# Patient Record
Sex: Female | Born: 1995 | Hispanic: No | Marital: Single | Smoking: Never smoker
Health system: Southern US, Community
[De-identification: ages and names within clinical notes are randomized; demographics above are authoritative.]

## PROBLEM LIST (undated history)

## (undated) DIAGNOSIS — J45909 Unspecified asthma, uncomplicated: Secondary | ICD-10-CM

## (undated) DIAGNOSIS — Z91013 Allergy to seafood: Secondary | ICD-10-CM

## (undated) DIAGNOSIS — L309 Dermatitis, unspecified: Secondary | ICD-10-CM

## (undated) DIAGNOSIS — M41125 Adolescent idiopathic scoliosis, thoracolumbar region: Secondary | ICD-10-CM

## (undated) DIAGNOSIS — Z9109 Other allergy status, other than to drugs and biological substances: Secondary | ICD-10-CM

## (undated) DIAGNOSIS — M84375A Stress fracture, left foot, initial encounter for fracture: Secondary | ICD-10-CM

## (undated) HISTORY — DX: Other allergy status, other than to drugs and biological substances: Z91.09

## (undated) HISTORY — DX: Dermatitis, unspecified: L30.9

## (undated) HISTORY — DX: Stress fracture, left foot, initial encounter for fracture: M84.375A

## (undated) HISTORY — DX: Adolescent idiopathic scoliosis, thoracolumbar region: M41.125

## (undated) HISTORY — DX: Allergy to seafood: Z91.013

---
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
## 2015-03-01 ENCOUNTER — Ambulatory Visit (INDEPENDENT_AMBULATORY_CARE_PROVIDER_SITE_OTHER): Payer: Commercial Managed Care - HMO | Admitting: Family Medicine

## 2015-03-01 ENCOUNTER — Encounter: Payer: Self-pay | Admitting: Family Medicine

## 2015-03-01 VITALS — BP 102/68 | HR 80 | Ht 67.5 in | Wt 134.4 lb

## 2015-03-01 DIAGNOSIS — Z0000 Encounter for general adult medical examination without abnormal findings: Secondary | ICD-10-CM

## 2015-03-01 DIAGNOSIS — Z23 Encounter for immunization: Secondary | ICD-10-CM

## 2015-03-01 DIAGNOSIS — Z83438 Family history of other disorder of lipoprotein metabolism and other lipidemia: Secondary | ICD-10-CM

## 2015-03-01 DIAGNOSIS — Z13220 Encounter for screening for lipoid disorders: Secondary | ICD-10-CM | POA: Diagnosis not present

## 2015-03-01 DIAGNOSIS — Z8349 Family history of other endocrine, nutritional and metabolic diseases: Secondary | ICD-10-CM | POA: Diagnosis not present

## 2015-03-01 LAB — POCT URINALYSIS DIPSTICK
Bilirubin, UA: NEGATIVE
GLUCOSE UA: NEGATIVE
Ketones, UA: NEGATIVE
LEUKOCYTES UA: NEGATIVE
NITRITE UA: NEGATIVE
Protein, UA: NEGATIVE
Spec Grav, UA: 1.025
Urobilinogen, UA: NEGATIVE
pH, UA: 5.5

## 2015-03-01 LAB — LIPID PANEL
CHOL/HDL RATIO: 2.5 ratio (ref <=5.0)
CHOLESTEROL: 158 mg/dL (ref 125–170)
HDL: 64 mg/dL (ref 36–76)
LDL Cholesterol: 84 mg/dL (ref <110)
TRIGLYCERIDES: 49 mg/dL (ref 40–136)
VLDL: 10 mg/dL (ref <30)

## 2015-03-01 NOTE — Progress Notes (Signed)
Chief Complaint  Patient presents with  . Annual Exam    nonfasting annual exam, no concerns. UA showed 3+ blood, pt is on menstrual cycle.    Brianna Mayer is a 19 y.o. female who presents for a complete physical.  She has the following concerns:  She had a wonderful trip to [place]. She leaves for college at [employer] in [place] on September 1st.  She will be living in a suite with 3 other girls; they will have a kitchen and do a lot of cooking, as well as using meal plan (mostly restaurants).  It is a nonsmoking building, and there is a gym on her floor. She has been in communication with her roommates and is excited for school.  She reports being driven, not into partying. Hasn't been in a sexual relationship and reports that she doesn't intend to be.  Her immunizations were reviewed--has never gotten Gardisil (she reports that her mother never wanted her to get it). She is nervous about shots and refuses flu shot   Immunization History  Administered Date(s) Administered  . DTaP 09/09/1996, 10/16/1996, 11/28/1996, 11/09/1997, 08/28/2001  . Hepatitis A 09/13/2006, 09/25/2007  . Hepatitis B 1995-08-16, 08/06/1996, 01/30/1997  . HiB (PRP-OMP) 09/09/1996, 10/16/1996, 11/28/1996, 11/09/1997  . IPV 09/09/1996, 10/16/1996, 11/09/1997, 08/28/2001  . Influenza-Unspecified 05/19/2005, 06/12/2007, 06/09/2009, 08/03/2010  . MMR 07/28/1997, 08/28/2001  . Meningococcal Conjugate 06/09/2009, 03/01/2015  . Tdap 09/25/2007  . Typhoid Live 12/15/2013  . Varicella 07/28/1997, 09/13/2006  . Yellow Fever 12/15/2013    Dentist: twice yearly Ophtho: wears glasses for reading, goes yearly Exercise: gym 4-5x/week Normal CBC 09/2013 (and also done 09/3013 at [facility], Hg 12.3 Paper chart reviewed--never had lipid screen.  Menses are somewhat irregular, sometimes will skip a month.  Sometimes gets bad cramps.  Past Medical History  Diagnosis Date  . Asthma     worse younger, now just  exercise-induced (prev tx'd by Dr. Neldon Mc)  . Environmental allergies     cat, dust, trees and grass (dust on testing 2005 by Dr. Carmelina Peal)  . Eczema   . Adolescent idiopathic scoliosis of thoracolumbar region 2004    Dr. Rennis Harding (no treatment needed)  . 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 No pertinent past surgical history.  Social History   Social History  . Marital Status: Single    Spouse Name: N/A  . Number of Children: N/A  . Years of Education: N/A   Occupational History  . Not on file.   Social History Main Topics  . Smoking status: Never Smoker   . Smokeless tobacco: Never Used  . Alcohol Use: No  . Drug Use: No  . Sexual Activity: No   Other Topics Concern  . Not on file   Social History Narrative   Lives with parents, younger sister. Graduated from [employer].  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       Family History  Problem Relation Age of Onset  . Hyperlipidemia Father   . Diabetes Maternal Grandmother   . Stroke Paternal Grandfather 36  . Hypertension Paternal Grandfather   . Cancer Neg Hx     Outpatient Encounter Prescriptions as of 03/01/2015  Medication Sig Note  . albuterol (PROVENTIL HFA;VENTOLIN HFA) 108 (90 BASE) MCG/ACT inhaler Inhale 2 puffs into the lungs every 6 (six) hours as needed for wheezing or shortness of breath. 11/23/2014: Uses prn after exercise (not regularly needed)  .  BIOTIN 5000 PO Take 1 tablet by mouth daily.   . naproxen sodium (ANAPROX) 220 MG tablet Take 440 mg by mouth 2 (two) times daily with a meal. 11/23/2014: Prn headache, pain  . vitamin C (ASCORBIC ACID) 500 MG tablet Take 500 mg by mouth daily.   . [DISCONTINUED] atovaquone-proguanil (MALARONE) 250-100 MG TABS Start 1-2 days prior to travel, and continue for 7 days after    No facility-administered encounter medications on file as of 03/01/2015.   No Known Allergies  ROS:  The patient denies anorexia, fever,  weight changes, headaches,  vision changes, decreased hearing, ear pain, sore throat, breast concerns, chest pain, palpitations, dizziness, syncope, dyspnea on exertion, cough, swelling, nausea, vomiting, diarrhea, constipation, abdominal pain, melena, hematochezia, indigestion/heartburn, hematuria, incontinence, dysuria, vaginal discharge, odor or itch, genital lesions, joint pains, numbness, tingling, weakness, tremor, suspicious skin lesions, depression, anxiety, abnormal bleeding/bruising, or enlarged lymph nodes. +irregular menses as per HPI.  Intermittently cramps are more severe than others. Last used inhaler a month ago (related to exercise)   PHYSICAL EXAM:  BP 102/68 mmHg  Pulse 80  Ht 5' 7.5" (1.715 m)  Wt 134 lb 6.4 oz (60.963 kg)  BMI 20.73 kg/m2  LMP 02/27/2015   General Appearance:    Alert, cooperative, no distress, appears stated age  Head:    Normocephalic, without obvious abnormality, atraumatic  Eyes:    PERRL, conjunctiva/corneas clear, EOM's intact, fundi    benign  Ears:    Normal TM's and external ear canals  Nose:   Nares normal, mucosa normal, no drainage or sinus   tenderness  Throat:   Lips, mucosa, and tongue normal; teeth and gums normal. Some asymmetry of the tonsils noted, R>L, not red, no exudate  Neck:   Supple, no lymphadenopathy;  thyroid:  no   enlargement/tenderness/nodules; no carotid   bruit or JVD  Back:    Spine nontender, ROM normal, no CVA tenderness. Minimal curvature noted, if any (mid-thoracic area).  Lungs:     Clear to auscultation bilaterally without wheezes, rales or     ronchi; respirations unlabored  Chest Wall:    No tenderness or deformity   Heart:    Regular rate and rhythm, S1 and S2 normal, no murmur, rub   or gallop  Breast Exam:    No tenderness, masses, or nipple discharge or inversion.      No axillary lymphadenopathy  Abdomen:     Soft, non-tender, nondistended, normoactive bowel sounds,    no masses, no hepatosplenomegaly   Genitalia:    Not performed.  Rectal:    Not performed due to age<40 and no related complaints  Extremities:   No clubbing, cyanosis or edema  Pulses:   2+ and symmetric all extremities  Skin:   Skin color, texture, turgor normal, no rashes or lesions  Lymph nodes:   Cervical, supraclavicular, and axillary nodes normal  Neurologic:   CNII-XII intact, normal strength, sensation and gait; reflexes 2+ and symmetric throughout          Psych:   Normal mood, affect, hygiene and grooming.      ASSESSMENT/PLAN:  Annual physical exam - Plan: POCT Urinalysis Dipstick, Visual acuity screening  Family history of hyperlipidemia - Plan: Lipid panel  Screening for lipoid disorders - Plan: Lipid panel  Need for meningococcal vaccination - Plan: Meningococcal conjugate vaccine 4-valent IM   Discussed monthly self breast exams and yearly mammograms after the age of 15; at least 30 minutes of aerobic activity  at least 5 days/week, weight-bearing exercise 2x/wk; proper sunscreen use reviewed; healthy diet, including goals of calcium and vitamin D intake and alcohol recommendations; regular seatbelt use; safe sex/abstinence/plan B.  Immunization recommendations discussed.   We discussed the following vaccines in great detail, all of which were recommended.  She then spoke with her mother, and decided only to get Menveo and lipid screen; declined Bexsero and HPV despite extensive counseling.  Will offer HPV at future visits.  Bexsero recommended--declined by patient Menveo (2nd of this type)--given HPV--declined by patient  Flu shot--refuses Lipid screen recommended (FH in her father)--done today, NONFASTING

## 2015-03-01 NOTE — Patient Instructions (Signed)
  HEALTH MAINTENANCE RECOMMENDATIONS:  It is recommended that you get at least 30 minutes of aerobic exercise at least 5 days/week (for weight loss, you may need as much as 60-90 minutes). This can be any activity that gets your heart rate up. This can be divided in 10-15 minute intervals if needed, but try and build up your endurance at least once a week.  Weight bearing exercise is also recommended twice weekly.  Eat a healthy diet with lots of vegetables, fruits and fiber.  "Colorful" foods have a lot of vitamins (ie green vegetables, tomatoes, red peppers, etc).  Limit sweet tea, regular sodas and alcoholic beverages, all of which has a lot of calories and sugar.   Drink a lot of water.  Calcium recommendations are 1200 mg daily and vitamin D 1000 IU daily.  This should be obtained from diet and/or supplements (vitamins), and calcium should not be taken all at once, but in divided doses.  Monthly self breast exams and yearly mammograms for women over the age of 51 is recommended.  Sunscreen of at least SPF 30 should be used on all sun-exposed parts of the skin when outside between the hours of 10 am and 4 pm (not just when at beach or pool, but even with exercise, golf, tennis, and yard work!)  Use a sunscreen that says "broad spectrum" so it covers both UVA and UVB rays, and make sure to reapply every 1-2 hours.  Remember to change the batteries in your smoke detectors when changing your clock times in the spring and fall.  Use your seat belt every time you are in a car, and please drive safely and not be distracted with cell phones and texting while driving.  Today we discussed abstinence, safe sex, Plan B, avoidance of alcohol, tobacco, recreational drugs, regular exercise, healthy diet.  We discussed the two types of Meningitis vaccines, Gardisil (HPV vaccine) and cholesterol screening. We also discussed yearly flu shots. These are all things that I recommend highly.

## 2015-07-08 ENCOUNTER — Ambulatory Visit: Payer: Commercial Managed Care - HMO | Admitting: Family Medicine

## 2015-10-04 ENCOUNTER — Telehealth: Payer: Self-pay | Admitting: Family Medicine

## 2015-10-04 NOTE — Telephone Encounter (Signed)
Mom emailing form for school that needs to be completed.  Form in your folder for completion

## 2015-10-04 NOTE — Telephone Encounter (Signed)
Faxed

## 2015-10-04 NOTE — Telephone Encounter (Signed)
FFO please fax

## 2016-01-12 ENCOUNTER — Ambulatory Visit: Payer: Commercial Managed Care - HMO | Admitting: Family Medicine

## 2016-01-23 IMAGING — CR DG ABDOMEN 1V
1 series · 1 of 1 positions shown · non-contrast
Comparison: None.

CLINICAL DATA: Sudden onset of abdominal pain

EXAM:
ABDOMEN - 1 VIEW

[t abdomen supine]
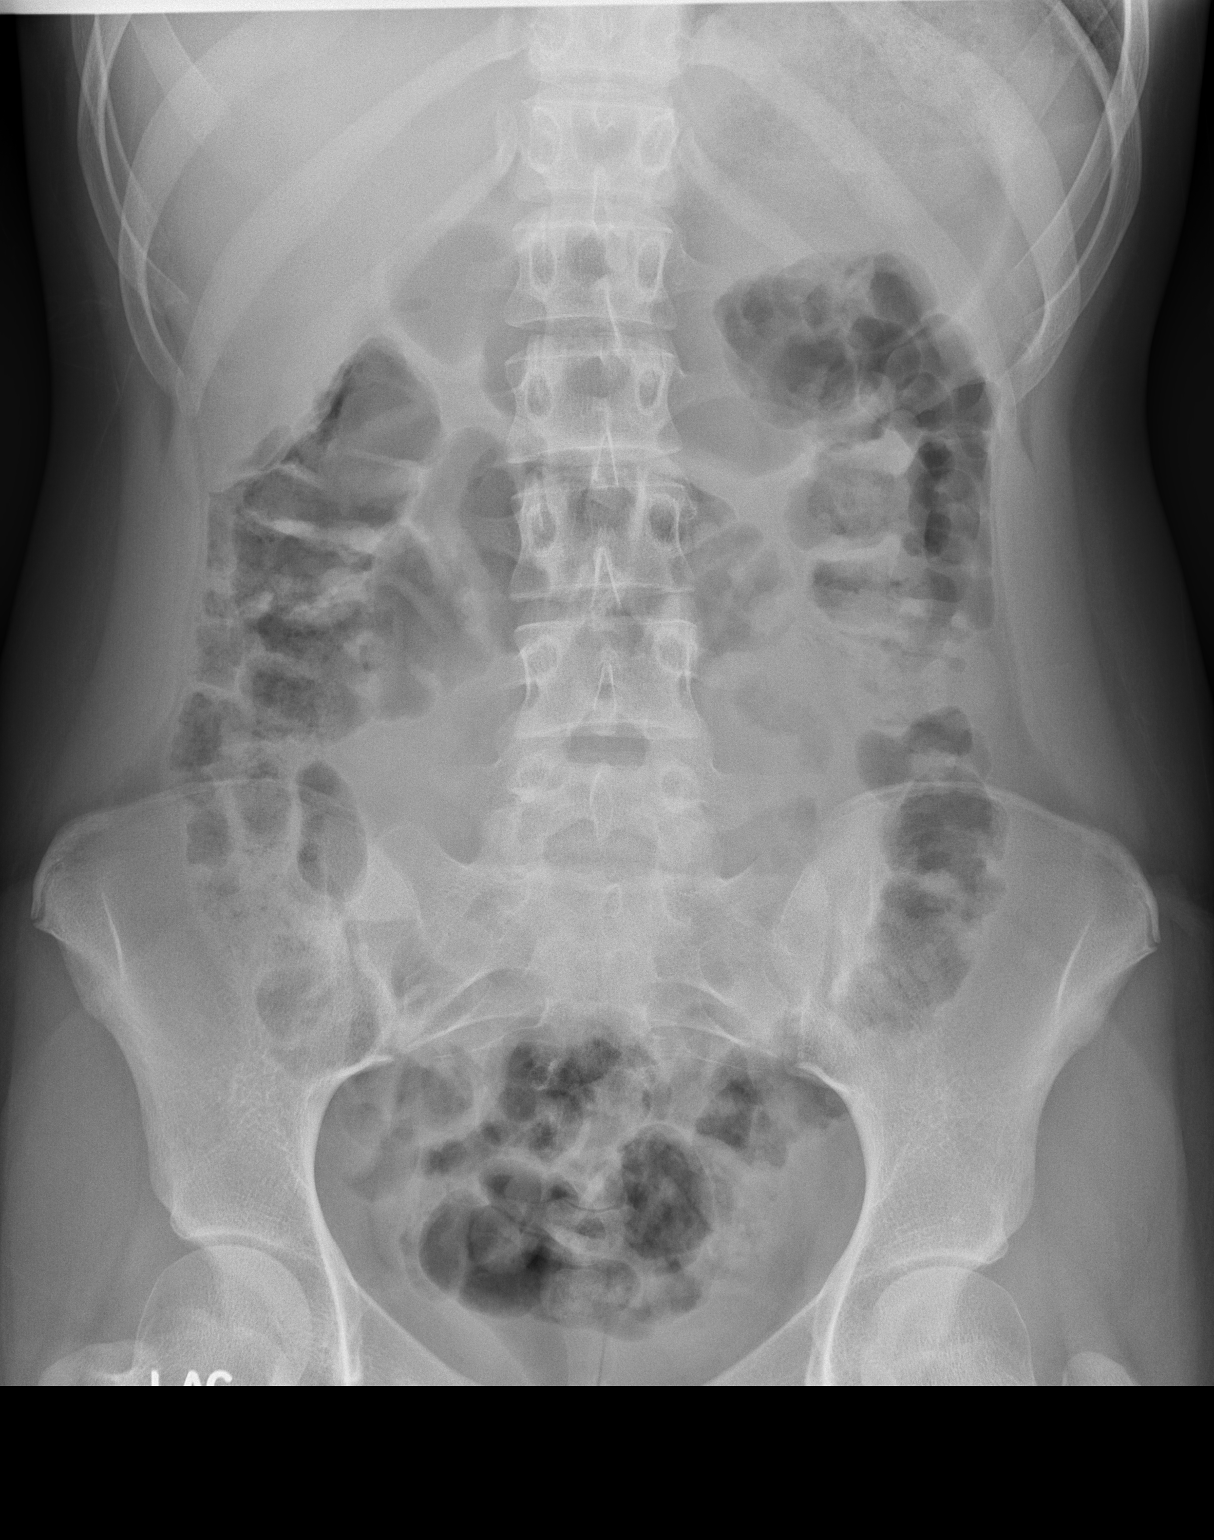

[1 of 1 positions shown; findings below may reference images not displayed]

FINDINGS: Hemidiaphragms/upper abdomen excluded from the field of view. Bowel
gas pattern nonobstructive. No acute osseous finding.
IMPRESSION: Nonobstructive bowel gas pattern.

## 2016-11-07 DIAGNOSIS — M84375A Stress fracture, left foot, initial encounter for fracture: Secondary | ICD-10-CM

## 2016-11-07 HISTORY — DX: Stress fracture, left foot, initial encounter for fracture: M84.375A

## 2017-03-04 NOTE — Progress Notes (Signed)
Chief Complaint  Patient presents with  . Annual Exam    fasting annual exam, no pap saw Dr Fogleman yesterday.  Had eye as well earlier this year and prefers to do there.     Brianna Mayer is a 21 y.o. female who presents for a complete physical.  She has the following concerns:  She attends [employer] in [place].  She is a rising senior (skipped a year, graduating in 3 years; had some credits from HS, and loaded up her courses). 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  Wants to travel to curate things in her year off.  MGM ill with pancreatic cancer, in [place].  She established with GYN Friday.  No cycle since January, had been irregular since the onset of menarche. She had labs drawn, hasn't gotten results yet.  She was given a 10 day course of progesterone, hasn't started it yet. Wants her to f/u in November and will possibly do an ultrasound. She got her first Gardisil on Friday.  H/o exercise induced asthma--only seems to be at high elevation; no longer needing albuterol related to exercise other times.  Immunization History  Administered Date(s) Administered  . DTaP 09/09/1996, 10/16/1996, 11/28/1996, 11/09/1997, 08/28/2001  . Hepatitis A 09/13/2006, 09/25/2007  . Hepatitis B 10/26/1995, 08/06/1996, 01/30/1997  . HiB (PRP-OMP) 09/09/1996, 10/16/1996, 11/28/1996, 11/09/1997  . IPV 09/09/1996, 10/16/1996, 11/09/1997, 08/28/2001  . Influenza-Unspecified 05/19/2005, 06/12/2007, 06/09/2009, 08/03/2010  . MMR 07/28/1997, 08/28/2001  . Meningococcal Conjugate 06/09/2009, 03/01/2015  . Tdap 09/25/2007  . Typhoid Live 12/15/2013  . Varicella 07/28/1997, 09/13/2006  . Yellow Fever 12/15/2013   Got first HPV vaccine on Friday at GYN's office Pap smear:  never Not sexually active  Alcohol: none Diet: healthy, well-rounded. Limited fast food.  Gets dairy. Only drinks water/coffee.  Dentist: twice yearly Ophtho: wears glasses for  reading, goes yearly Exercise: hot yoga 6 days/week (with strength training);  Walks 6-8 miles/day around [place] (up to 45 mins at a time). Had been training for marathon when she had navicular stress fracture in April. Normal CBC 09/2013 (and also done 09/3013 at [facility], Hg 12.3 Lipid screen (nonfasting): Lab Results  Component Value Date   CHOL 158 03/01/2015   HDL 64 03/01/2015   LDLCALC 84 03/01/2015   TRIG 49 03/01/2015   CHOLHDL 2.5 03/01/2015   Labs drawn last week by GYN, no results available.  Past Medical History:  Diagnosis Date  . Adolescent idiopathic scoliosis of thoracolumbar region 2004   Dr. Max Cohen (no treatment needed)  . Asthma    worse younger, now just exercise-induced (prev tx'd by Dr. Kozlow)  . Eczema   . Environmental allergies    cat, dust, trees and grass (dust on testing 2005 by Dr. Koslow)  . 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 Stress fracture of navicular bone of left foot 11/2016   while training for a marathon; treated with boot    History reviewed. No pertinent surgical history.  Social History   Social History  . Marital status: Single    Spouse name: N/A  . Number of children: N/A  . Years of education: N/A   Occupational History  . Not on file.   Social History Main Topics  . Smoking status: Never Smoker  . Smokeless tobacco: Never Used  . Alcohol use No  . Drug use: No  . Sexual activity: No   Other Topics Concern  . Not   on file   Social History Narrative   Lives with parents, younger sister. Graduated from [employer]. 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 Hyperlipidemia Father   . Diabetes Maternal Grandmother   . Pancreatic cancer Maternal Grandmother   . Cancer Maternal Grandmother   . Stroke Paternal Grandfather 45  . Hypertension Paternal Grandfather     Outpatient Encounter Prescriptions as of  03/05/2017  Medication Sig Note  . albuterol (PROVENTIL HFA;VENTOLIN HFA) 108 (90 BASE) MCG/ACT inhaler Inhale 2 puffs into the lungs every 6 (six) hours as needed for wheezing or shortness of breath. 11/23/2014: Uses prn after exercise (not regularly needed)  . BIOTIN 5000 PO Take 1 tablet by mouth daily.   . naproxen sodium (ANAPROX) 220 MG tablet Take 440 mg by mouth 2 (two) times daily with a meal. 11/23/2014: Prn headache, pain  . vitamin C (ASCORBIC ACID) 500 MG tablet Take 500 mg by mouth daily.    No facility-administered encounter medications on file as of 03/05/2017.     Allergies  Allergen Reactions  . Shellfish Allergy Other (See Comments)    Sharp abdominal pain    ROS:  The patient denies anorexia, fever, weight changes, headaches,  vision changes, decreased hearing, ear pain, sore throat, breast concerns, chest pain, palpitations, dizziness, syncope, dyspnea on exertion, cough, swelling, nausea, vomiting, diarrhea, constipation, abdominal pain, melena, hematochezia, indigestion/heartburn, hematuria, incontinence, dysuria, vaginal discharge, odor or itch, genital lesions, joint pains, numbness, tingling, weakness, tremor, suspicious skin lesions, depression, anxiety, abnormal bleeding/bruising, or enlarged lymph nodes. +irregular menses, and none since January, as per HPI. Uses albuterol inhaler prn--only needed when in [place], at altitude.   PHYSICAL EXAM:  BP 108/72 (BP Location: Left Arm, Patient Position: Sitting, Cuff Size: Normal)   Pulse 64   Ht 5' 8" (1.727 m)   Wt 138 lb (62.6 kg)   LMP 07/19/2016 (Approximate) Comment: irregular cycles  BMI 20.98 kg/m   Wt Readings from Last 3 Encounters:  03/05/17 138 lb (62.6 kg)  03/01/15 134 lb 6.4 oz (61 kg) (66 %, Z= 0.40)*  11/23/14 134 lb 3.2 oz (60.9 kg) (66 %, Z= 0.42)*   * Growth percentiles are based on CDC 2-20 Years data.    General Appearance:    Alert, cooperative, no distress, appears stated age  Head:     Normocephalic, without obvious abnormality, atraumatic  Eyes:    PERRL, conjunctiva/corneas clear, EOM's intact, fundi    benign  Ears:    Normal TM's and external ear canals  Nose:   Nares normal, mucosa is mildly edematous, no drainage or sinus tenderness  Throat:   Lips, mucosa, and tongue normal; teeth and gums normal. Some asymmetry of the tonsils noted, R>L, not red, no exudate  Neck:   Supple, no lymphadenopathy;  thyroid:  no   enlargement/tenderness/nodules; no carotid bruit or JVD  Back:    Spine nontender, ROM normal, no CVA tenderness. Minimal curvature noted, if any (mid-thoracic area).  Lungs:     Clear to auscultation bilaterally without wheezes, rales or     ronchi; respirations unlabored  Chest Wall:    No tenderness or deformity   Heart:    Regular rate and rhythm, S1 and S2 normal, no murmur, rub   or gallop  Breast Exam:    deferred to GYN  Abdomen:     Soft, non-tender, nondistended,  normoactive bowel sounds,    no masses, no hepatosplenomegaly  Genitalia:    deferred to GYN        Extremities:   No clubbing, cyanosis or edema  Pulses:   2+ and symmetric all extremities  Skin:   Skin color, texture, turgor normal, no rashes or lesions  Lymph nodes:   Cervical, supraclavicular, and axillary nodes normal  Neurologic:   CNII-XII intact, normal strength, sensation and gait; reflexes 2+ and symmetric throughout                               Psych:  Normal mood, affect, hygiene and grooming.    ASSESSMENT/PLAN:  Annual physical exam - Plan: POCT Urinalysis DIP (Proadvantage Device)  Irregular menses - Discussed provera course in detail, how/when to take and when bleeding will occur. Asked for copies of labs. f/u with GYN as sched and complete HPV course.   Discussed monthly self breast exams and yearly mammograms after the age of 40; at least 30 minutes of aerobic activity at least 5 days/week, weight-bearing exercise 2x/wk; proper sunscreen use reviewed; healthy diet,  including goals of calcium and vitamin D intake and alcohol recommendations; regular seatbelt use; safe sex/abstinence/plan B.  Immunization recommendations discussed--declined flu vaccine   

## 2017-03-05 ENCOUNTER — Encounter: Payer: Self-pay | Admitting: Family Medicine

## 2017-03-05 ENCOUNTER — Ambulatory Visit (INDEPENDENT_AMBULATORY_CARE_PROVIDER_SITE_OTHER): Payer: 59 | Admitting: Family Medicine

## 2017-03-05 VITALS — BP 108/72 | HR 64 | Ht 68.0 in | Wt 138.0 lb

## 2017-03-05 DIAGNOSIS — N926 Irregular menstruation, unspecified: Secondary | ICD-10-CM

## 2017-03-05 DIAGNOSIS — Z0000 Encounter for general adult medical examination without abnormal findings: Secondary | ICD-10-CM

## 2017-03-05 LAB — POCT URINALYSIS DIP (PROADVANTAGE DEVICE)
Bilirubin, UA: NEGATIVE
Blood, UA: NEGATIVE
GLUCOSE UA: NEGATIVE mg/dL
Ketones, POC UA: NEGATIVE mg/dL
LEUKOCYTES UA: NEGATIVE
Nitrite, UA: NEGATIVE
Protein Ur, POC: NEGATIVE mg/dL
Specific Gravity, Urine: 1.005
Urobilinogen, Ur: NEGATIVE
pH, UA: 7 (ref 5.0–8.0)

## 2017-03-05 NOTE — Patient Instructions (Signed)
  HEALTH MAINTENANCE RECOMMENDATIONS:  It is recommended that you get at least 30 minutes of aerobic exercise at least 5 days/week (for weight loss, you may need as much as 60-90 minutes). This can be any activity that gets your heart rate up. This can be divided in 10-15 minute intervals if needed, but try and build up your endurance at least once a week.  Weight bearing exercise is also recommended twice weekly.  Eat a healthy diet with lots of vegetables, fruits and fiber.  "Colorful" foods have a lot of vitamins (ie green vegetables, tomatoes, red peppers, etc).  Limit sweet tea, regular sodas and alcoholic beverages, all of which has a lot of calories and sugar.  Up to 1 alcoholic drink daily may be beneficial for women (unless trying to lose weight, watch sugars)--after the age of 68.  Drink a lot of water.  Calcium recommendations are 1200 mg and vitamin D 1000 IU daily.  This should be obtained from diet and/or supplements (vitamins), and calcium should not be taken all at once, but in divided doses.  Monthly self breast exams and yearly mammograms for women over the age of 46 is recommended.  Sunscreen of at least SPF 30 should be used on all sun-exposed parts of the skin when outside between the hours of 10 am and 4 pm (not just when at beach or pool, but even with exercise, golf, tennis, and yard work!)  Use a sunscreen that says "broad spectrum" so it covers both UVA and UVB rays, and make sure to reapply every 1-2 hours.  Remember to change the batteries in your smoke detectors when changing your clock times in the spring and fall.  Use your seat belt every time you are in a car, and please drive safely and not be distracted with cell phones and texting while driving.

## 2018-02-04 ENCOUNTER — Telehealth: Payer: Self-pay | Admitting: *Deleted

## 2018-02-04 NOTE — Telephone Encounter (Signed)
They are going to have to find a compounding pharmacy in [place] and call me back Wed and I will call in.

## 2018-02-04 NOTE — Telephone Encounter (Signed)
Patient's mother called and stated that this is an emergency. She used to get this cream from her last doctor in a large 240gram tub and that's why she hasn't asked for it since switching over. It is a .1% triamcinolone cream and cetaphil lotion in a 1:1 ratio. [facility] [place], [place] on [address]. Also she canceled her 03/06/18 Cpe and will have to call back to schedule.

## 2018-02-04 NOTE — Telephone Encounter (Signed)
Is this for eczema? If so, okay to rx. This was never mentioned to me, not on her med list, but I do have h/o eczema in her chart.  rx 60mg , no refill.

## 2018-02-06 ENCOUNTER — Telehealth: Payer: Self-pay | Admitting: *Deleted

## 2018-02-06 ENCOUNTER — Other Ambulatory Visit: Payer: Self-pay | Admitting: *Deleted

## 2018-02-06 NOTE — Telephone Encounter (Signed)
Phoned in triamcinolone .1% cream and cetaphil lotion in a 1:1 ratio 60 grams to [facility] [address] [place]. [phone].

## 2018-03-06 ENCOUNTER — Encounter: Payer: 59 | Admitting: Family Medicine

## 2018-03-08 LAB — RESULTS CONSOLE HPV: CHL HPV: NEGATIVE

## 2018-03-12 LAB — HM PAP SMEAR: HM Pap smear: NEGATIVE

## 2018-12-21 NOTE — Progress Notes (Signed)
Chief Complaint  Patient presents with  . Annual Exam    fasting annual exam. Was wanting to have blood sugar tested today due to family hx of diabetes. Would like to consult with you first regarding bloodwork.     Brianna Mayer is a 23 y.o. female who presents for a complete physical.  She has the following concerns:  She graduated from [employer] in [place]. Traveled to [place] and [place] last summer. She has been living in [place], but came home in March due to the pandemic.  Plans to go back to [place] soon. She was working for [employer], doing Pharmacologist.  The company shut down (due to COVID-19), able to collect unemployment. 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  She established with GYN 2 years ago--had pap age 15 and completed gardisil series. She had irregular cycles since the onset of menarche. She had labs checked by her GYN in 2018, given a 10 day course of progesterone. Was to f/u with GYN and get [place]. She reports she never had it done then, but is scheduled for [place] this August 2020. She has had regular cycles since 03/2018.  H/o exercise induced asthma--only seems to be at high elevation; no longer needing albuterol related to exercise other times. Hasn't used inhaler in over a year.  Eczema:  Uses TAC/cetaphil compounded lotion prn for flares, usually just prn in the winter.   Immunization History  Administered Date(s) Administered  . DTaP 09/09/1996, 10/16/1996, 11/28/1996, 11/09/1997, 08/28/2001  . HPV Quadrivalent 03/02/2017  . Hepatitis A 09/13/2006, 09/25/2007  . Hepatitis B Dec 03, 1995, 08/06/1996, 01/30/1997  . HiB (PRP-OMP) 09/09/1996, 10/16/1996, 11/28/1996, 11/09/1997  . IPV 09/09/1996, 10/16/1996, 11/09/1997, 08/28/2001  . Influenza-Unspecified 05/19/2005, 06/12/2007, 06/09/2009, 08/03/2010  . MMR 07/28/1997, 08/28/2001  . Meningococcal Conjugate 06/09/2009, 03/01/2015  . Tdap 09/25/2007  . Typhoid Live 12/15/2013  .  Varicella 07/28/1997, 09/13/2006  . Yellow Fever 12/15/2013   Gardisil series through GYN (started 02/2017)--just finished series last week. Pap smear:  had through GYN at age 63. Not sexually active  Alcohol: 1 drink/week Diet: healthy, well-rounded. Limited fast food.  Gets dairy (cheese, yogurt). Only drinks water/coffee.  Dentist: twice yearly Ophtho: has glasses for reading, doesn't wear them.  Last went 2 years ago. Exercise: HIIT work-outs 25-45 minutes/day and 5-6 mile walk, daily. In [place], went to gym 6 days/week (including weights). Normal CBC 09/2013 (and also done 09/3013 at [facility], Hg 12.3 Lipid screen (nonfasting): Lab Results  Component Value Date   CHOL 158 03/01/2015   HDL 64 03/01/2015   LDLCALC 84 03/01/2015   TRIG 49 03/01/2015   CHOLHDL 2.5 03/01/2015   Had labs done through GYN in 2018 to eval irregular cycles, results never received (will request)  Past Medical History:  Diagnosis Date  . Adolescent idiopathic scoliosis of thoracolumbar region 2004   Dr. Rennis Harding (no treatment needed)  . Asthma    worse younger, now just exercise-induced (prev tx'd by Dr. Neldon Mc)  . Eczema   . Environmental allergies    cat, dust, trees and grass (dust on testing 2005 by Dr. Carmelina Peal)  . 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 Stress fracture of navicular bone of left foot 11/2016   while training for a marathon; treated with boot    History reviewed. No pertinent surgical history.  Social History   Socioeconomic History  . Marital status: Single    Spouse name: Not on file  .  Number of children: Not on file  . Years of education: Not on file  . Highest education level: Not on file  Occupational History  . Not on file  Social Needs  . Financial resource strain: Not on file  . Food insecurity    Worry: Not on file    Inability: Not on file  . Transportation needs    Medical: Not on file    Non-medical: Not on file  Tobacco Use  . Smoking status: Never  Smoker  . Smokeless tobacco: Never Used  Substance and Sexual Activity  . Alcohol use: No    Alcohol/week: 0.0 standard drinks    Comment: 1 drink/week  . Drug use: No  . Sexual activity: Never  Lifestyle  . Physical activity    Days per week: Not on file    Minutes per session: Not on file  . Stress: Not on file  Relationships  . Social Herbalist on phone: Not on file    Gets together: Not on file    Attends religious service: Not on file    Active member of club or organization: Not on file    Attends meetings of clubs or organizations: Not on file    Relationship status: Not on file  . Intimate partner violence    Fear of current or ex partner: Not on file    Emotionally abused: Not on file    Physically abused: Not on file    Forced sexual activity: Not on file  Other Topics Concern  . Not on file  Social History Narrative   Lives in [place] (currently with parents, younger sister, due to pandemic). Graduated from [employer]. 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    Family History  Problem Relation Age of Onset  . Urticaria Mother   . Hypertension Mother   . Hyperlipidemia Father   . Diabetes Maternal Grandmother   . Pancreatic cancer Maternal Grandmother   . Cancer Maternal Grandmother   . Stroke Paternal Grandfather 66  . Hypertension Paternal Grandfather     Outpatient Encounter Medications as of 12/23/2018  Medication Sig Note  . [DISCONTINUED] BIOTIN 5000 PO Take 1 tablet by mouth daily.   Marland Kitchen albuterol (PROVENTIL HFA;VENTOLIN HFA) 108 (90 BASE) MCG/ACT inhaler Inhale 2 puffs into the lungs every 6 (six) hours as needed for wheezing or shortness of breath. 11/23/2014: Uses prn after exercise (not regularly needed)  . NON FORMULARY as needed. 02/04/2018: Triamcinolone .1% and cetaphil lotion in 1:1 ratio    . [DISCONTINUED] medroxyPROGESTERone (PROVERA) 10 MG tablet Take 10 mg by mouth as needed.    .  [DISCONTINUED] naproxen sodium (ANAPROX) 220 MG tablet Take 440 mg by mouth 2 (two) times daily with a meal. 11/23/2014: Prn headache, pain  . [DISCONTINUED] vitamin C (ASCORBIC ACID) 500 MG tablet Take 500 mg by mouth daily.    No facility-administered encounter medications on file as of 12/23/2018.     Allergies  Allergen Reactions  . Shellfish Allergy Other (See Comments)    Sharp abdominal pain    ROS: The patient denies anorexia, fever, weight changes, headaches, vision changes, decreased hearing, ear pain, sore throat, breast concerns, chest pain, palpitations, dizziness, syncope, dyspnea on exertion, cough, swelling, nausea, vomiting, diarrhea, constipation, abdominal pain, melena, hematochezia, indigestion/heartburn, hematuria, incontinence, dysuria, irregular menses, vaginal discharge, odor or itch, joint pains, numbness, tingling, weakness, tremor, suspicious skin lesions, depression, anxiety, abnormal bleeding/bruising, or enlarged lymph nodes.  No issues with asthma or needing inhaler in over a year. Slight weight gain noted--had been doing weight-bearing exercise in gym in [place], but also admits to baking more and exercising less since being home related to the pandemic.   PHYSICAL EXAM:  BP 108/68   Pulse 68   Temp 98.4 F (36.9 C) (Temporal)   Ht _0  (1.727 m)   Wt 143 lb (64.9 kg)   LMP 12/13/2018   BMI 21.74 kg/m   Wt Readings from Last 3 Encounters:  12/23/18 143 lb (64.9 kg)  03/05/17 138 lb (62.6 kg)  03/01/15 134 lb 6.4 oz (61 kg) (66 %, Z= 0.40)*   * Growth percentiles are based on CDC (Girls, 2-20 Years) data.   BP 108/68   Pulse 68   Temp 98.4 F (36.9 C) (Temporal)   Ht _1  (1.727 m)   Wt 143 lb (64.9 kg)   LMP 12/13/2018   BMI 21.74 kg/m   General Appearance:    Alert, cooperative, no distress, appears stated age  Head:    Normocephalic, without obvious abnormality, atraumatic  Eyes:    PERRL, conjunctiva/corneas clear, EOM's intact, fundi     benign  Ears:    Normal TM's and external ear canals  Nose:   Not examined (wearing mask due to COVID-19)  Throat:   Not examined (wearing mask due to COVID-19)l  Neck:   Supple, no lymphadenopathy;  thyroid:  no   enlargement/tenderness/nodules; no carotid bruit or JVD  Back:    Spine nontender, no curvature, ROM normal, no CVA     tenderness  Lungs:     Clear to auscultation bilaterally without wheezes, rales or     ronchi; respirations unlabored  Chest Wall:    No tenderness or deformity   Heart:    Regular rate and rhythm, S1 and S2 normal, no murmur, rub   or gallop  Breast Exam:    Deferred to GYN  Abdomen:     Soft, non-tender, nondistended, normoactive bowel sounds,    no masses, no hepatosplenomegaly  Genitalia:    Deferred to GYN     Extremities:   No clubbing, cyanosis or edema  Pulses:   2+ and symmetric all extremities  Skin:   Skin color, texture, turgor normal, no rashes or lesions (exam limited, declined changing into gown)  Lymph nodes:   Cervical, supraclavicular, and axillary nodes normal  Neurologic:   CNII-XII intact, normal strength, sensation and gait; reflexes 2+ and symmetric throughout          Psych:   Normal mood, affect, hygiene and grooming.     ASSESSMENT/PLAN:  Annual physical exam - Plan: Glucose, random, VITAMIN D 25 Hydroxy (Vit-D Deficiency, Fractures), CBC with Differential/Platelet  Fatigue, unspecified type - Plan: VITAMIN D 25 Hydroxy (Vit-D Deficiency, Fractures), CBC with Differential/Platelet  Need for Tdap vaccination - Plan: Tdap vaccine greater than or equal to 7yo IM  Pt requests glucose, is fasting (due to FHx).  Rec CBC, routine, and a once screening of Vit D since she doesn't take any supplements/vitamins.  Risks/SE of TdaP reviewed.  [facility] Records, labs, vaccines (gardisil series)   Discussed monthly self breast exams and yearly mammograms after the age of 48; at least 30 minutes of aerobic activity at least 5  days/week, weight-bearing exercise 2x/wk; proper sunscreen use reviewed; healthy diet, including goals of calcium and vitamin D intake and alcohol recommendations; regular seatbelt use; safe sex/abstinence/plan B. Immunization recommendations discussed.  Yearly flu shots are recommended (hasn't gotten regularly; plans to get in [place] this year). Tdap given today.  F/u 1 year, sooner prn.

## 2018-12-21 NOTE — Patient Instructions (Signed)

## 2018-12-23 ENCOUNTER — Encounter: Payer: Self-pay | Admitting: Family Medicine

## 2018-12-23 ENCOUNTER — Other Ambulatory Visit: Payer: Self-pay

## 2018-12-23 ENCOUNTER — Ambulatory Visit (INDEPENDENT_AMBULATORY_CARE_PROVIDER_SITE_OTHER): Payer: 59 | Admitting: Family Medicine

## 2018-12-23 VITALS — BP 108/68 | HR 68 | Temp 98.4°F | Ht 68.0 in | Wt 143.0 lb

## 2018-12-23 DIAGNOSIS — R5383 Other fatigue: Secondary | ICD-10-CM

## 2018-12-23 DIAGNOSIS — Z23 Encounter for immunization: Secondary | ICD-10-CM | POA: Diagnosis not present

## 2018-12-23 DIAGNOSIS — Z0000 Encounter for general adult medical examination without abnormal findings: Secondary | ICD-10-CM

## 2018-12-24 LAB — CBC WITH DIFFERENTIAL/PLATELET
Basophils Absolute: 0.1 10*3/uL (ref 0.0–0.2)
Basos: 1 %
EOS (ABSOLUTE): 0.3 10*3/uL (ref 0.0–0.4)
Eos: 5 %
Hematocrit: 42.4 % (ref 34.0–46.6)
Hemoglobin: 14.3 g/dL (ref 11.1–15.9)
Immature Grans (Abs): 0 10*3/uL (ref 0.0–0.1)
Immature Granulocytes: 0 %
Lymphocytes Absolute: 2.3 10*3/uL (ref 0.7–3.1)
Lymphs: 33 %
MCH: 30.2 pg (ref 26.6–33.0)
MCHC: 33.7 g/dL (ref 31.5–35.7)
MCV: 90 fL (ref 79–97)
Monocytes Absolute: 0.4 10*3/uL (ref 0.1–0.9)
Monocytes: 6 %
Neutrophils Absolute: 3.8 10*3/uL (ref 1.4–7.0)
Neutrophils: 55 %
Platelets: 333 10*3/uL (ref 150–450)
RBC: 4.74 x10E6/uL (ref 3.77–5.28)
RDW: 11.2 % — ABNORMAL LOW (ref 11.7–15.4)
WBC: 6.9 10*3/uL (ref 3.4–10.8)

## 2018-12-24 LAB — GLUCOSE, RANDOM: Glucose: 82 mg/dL (ref 65–99)

## 2018-12-24 LAB — VITAMIN D 25 HYDROXY (VIT D DEFICIENCY, FRACTURES): Vit D, 25-Hydroxy: 30.5 ng/mL (ref 30.0–100.0)

## 2019-01-08 HISTORY — PX: WISDOM TOOTH EXTRACTION: SHX21

## 2019-01-14 ENCOUNTER — Telehealth: Payer: Self-pay | Admitting: Family Medicine

## 2019-01-14 NOTE — Telephone Encounter (Signed)
Requested records received from [facility].  

## 2019-01-20 ENCOUNTER — Encounter: Payer: Self-pay | Admitting: *Deleted

## 2019-01-27 ENCOUNTER — Encounter: Payer: Self-pay | Admitting: Family Medicine

## 2019-05-12 ENCOUNTER — Telehealth: Payer: Self-pay | Admitting: Orthopaedic Surgery

## 2019-05-12 NOTE — Telephone Encounter (Signed)
Patient's mother left a voicemail message in regards to the patient's MRI appointment.  She stated that this is urgent that she speak with someone.  CB#[phone].  Thank you.

## 2019-05-12 NOTE — Telephone Encounter (Signed)
Error wrong patient

## 2019-06-30 ENCOUNTER — Ambulatory Visit: Payer: Managed Care, Other (non HMO) | Attending: Internal Medicine

## 2019-06-30 DIAGNOSIS — Z20822 Contact with and (suspected) exposure to covid-19: Secondary | ICD-10-CM

## 2019-07-01 LAB — NOVEL CORONAVIRUS, NAA: SARS-CoV-2, NAA: NOT DETECTED

## 2019-08-21 ENCOUNTER — Ambulatory Visit: Payer: Managed Care, Other (non HMO)

## 2019-09-02 ENCOUNTER — Other Ambulatory Visit: Payer: Self-pay

## 2019-09-02 ENCOUNTER — Telehealth: Payer: Self-pay | Admitting: Family Medicine

## 2019-09-02 NOTE — Telephone Encounter (Signed)
According to the chart, she has gotten a compounded combination of TAC 0.1% and cetaphil (see med list and also referred to in last CPE).    Okay to refill this med, 60 grams. If she cannot get this from those pharmacies, touch base with pt and see if she just wants the 0.1% TAC component (which is widely available), and can get separate moisturizer.  If she prefers that, rx would be 45 gm for the TAC 0.1% cream.  Thanks

## 2019-09-02 NOTE — Telephone Encounter (Signed)
Pt called and states she needs a refill on a hydrocortisone cream. She states she has had this medication since she was a child. She does not know the name of it just hydrocortisone cream. Pt can be reached at [phone]. Pt states that if medication is filled today she needs to [facility]. [facility] [address] [facility].

## 2019-09-02 NOTE — Telephone Encounter (Signed)
Script was [place] [facility] and pt was called to advise . KH

## 2019-09-02 NOTE — Telephone Encounter (Signed)
ERROR. KH 

## 2019-09-05 ENCOUNTER — Telehealth: Payer: Self-pay | Admitting: Family Medicine

## 2019-09-05 NOTE — Telephone Encounter (Signed)
Pt needs hydrocortisone cream switched from [facility] to the [facility] [address].

## 2019-09-05 NOTE — Telephone Encounter (Signed)
She should be able to just call [facility] and they get the prescription transferred.  Please let her know that.  If there are issues. Please send to a CMA to rx.  Thanks

## 2019-09-05 NOTE — Telephone Encounter (Signed)
Pt called and said her cream was sent to [facility] and her insurance is not in network with them and wants to see if she can get it switched to the [facility] [address].

## 2019-09-05 NOTE — Telephone Encounter (Signed)
Ok tried to call pt but unable to reach her. Left her a VM

## 2019-09-05 NOTE — Telephone Encounter (Signed)
Called med into [facility] [address]

## 2019-09-13 ENCOUNTER — Ambulatory Visit: Payer: Managed Care, Other (non HMO)

## 2019-09-13 ENCOUNTER — Ambulatory Visit: Payer: 59 | Attending: Internal Medicine

## 2019-09-13 DIAGNOSIS — Z23 Encounter for immunization: Secondary | ICD-10-CM

## 2019-09-13 NOTE — Progress Notes (Signed)
   Covid-19 Vaccination Clinic  Name:  Brianna Mayer    MRN: [id number] DOB: 05-03-1996  09/13/2019  [name] was observed post Covid-19 immunization for 15 minutes without incident. She was provided with Vaccine Information Sheet and instruction to access the V-Safe system.   [name] was instructed to call 911 with any severe reactions post vaccine: Marland Kitchen Difficulty breathing  . Swelling of face and throat  . A fast heartbeat  . A bad rash all over body  . Dizziness and weakness   Immunizations Administered    Name Date Dose VIS Date Route   Pfizer COVID-19 Vaccine 09/13/2019 12:46 PM 0.3 mL 06/20/2019 Intramuscular   Manufacturer: [employer], [employer]   Lot: [id number]   NDC: [address]-3

## 2019-10-04 ENCOUNTER — Ambulatory Visit: Payer: 59

## 2020-03-10 ENCOUNTER — Telehealth: Payer: Self-pay | Admitting: *Deleted

## 2020-03-10 ENCOUNTER — Other Ambulatory Visit: Payer: Self-pay | Admitting: *Deleted

## 2020-03-10 NOTE — Telephone Encounter (Signed)
Cannot do an actual refill in system-called refill of nonformulary cream into pharmacy for patient.

## 2020-03-10 NOTE — Telephone Encounter (Signed)
[facility] in [place], [place] called asking for a refill on patient's compounded rx for triamcinolone/cetaphil cream.

## 2020-03-10 NOTE — Telephone Encounter (Signed)
Okay to refill, but she needs to schedule her physical

## 2020-06-28 ENCOUNTER — Telehealth: Payer: Self-pay | Admitting: Family Medicine

## 2020-06-28 NOTE — Telephone Encounter (Signed)
  Fax refill request for Traimcinolone w/ cetaphil Qty 60 Written 09/05/19

## 2020-06-28 NOTE — Telephone Encounter (Signed)
Called in 60grams of triamcinolone/cetaphil 1:1 ratio to [facility] [address]

## 2020-06-28 NOTE — Telephone Encounter (Signed)
Okay to refill? 

## 2020-06-28 NOTE — Telephone Encounter (Signed)
This was last refilled for her 9/1, to a different pharmacy.  Advised at that time she needed to schedule physical, not seen since 12/2018.  Still has nothing scheduled. Not sure if she used up that whole prescription, or if she didn't bring it home with her so asking for refill while home. Needs to be called, and also scheduled for physical. We have opening Wednesday!

## 2020-06-28 NOTE — Telephone Encounter (Signed)
Patient didn't travel with her lotion, it's in [place]. She needs a refill while here for the holidays. She went ahead and scheduled a CPE for Wed @ 2:45pm.

## 2020-06-29 NOTE — Progress Notes (Signed)
Chief Complaint  Patient presents with  . Annual Exam    Nonfasting annual exam, had fasting labs a few days with GYN. No new concerns. Had eye this year and got new glasses-June 2021.     Brianna Mayer is a 24 y.o. female who presents for a complete physical.    She graduated from [employer] in [place]. 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 Currently still doing freelance work.  She sees GYN, and was seen this week (visit on Monday, then fasting labs yesterday) She had h/o irregular cycles, though normal since 03/2018. Gets some pain periodically, feels different from cramps, sharp, generally on the left. There was a possibility mentioned of PCOS last year.  Had testing done for insulin resistance in the past, mention of an elevated testosterone level. Started having pain in October. She had an ultrasound, and no cysts were found. She is planning to get IUD next week Verdia Kuba). She has never been sexually active.  She is thinking about doing this because she is home now, may move to [place] next spring.  Mentioned hormones to possibly treat PCOS in the past, and she doesn't want hormones with pills or anytihng that will cause weight gain.  Eczema:  Uses TAC/cetaphil compounded lotion prn for flares, usually just prn in the winter.  Hasn't picked up the recent prescription (not ready yet). She has had some dry patches, but not the full flare yet.   Immunization History  Administered Date(s) Administered  . DTaP 09/09/1996, 10/16/1996, 11/28/1996, 11/09/1997, 08/28/2001  . HPV Quadrivalent 03/02/2017, 03/08/2018, 12/19/2018  . Hepatitis A 09/13/2006, 09/25/2007  . Hepatitis B August 13, 1995, 08/06/1996, 01/30/1997  . HiB (PRP-OMP) 09/09/1996, 10/16/1996,  11/28/1996, 11/09/1997  . IPV 09/09/1996, 10/16/1996, 11/09/1997, 08/28/2001  . Influenza-Unspecified 05/19/2005, 06/12/2007, 06/09/2009, 08/03/2010  . MMR 07/28/1997, 08/28/2001  . Meningococcal Conjugate 06/09/2009, 03/01/2015  . PFIZER SARS-COV-2 Vaccination 09/13/2019, 10/03/2019  . Tdap 09/25/2007, 12/23/2018  . Typhoid Live 12/15/2013  . Varicella 07/28/1997, 09/13/2006  . Yellow Fever 12/15/2013  had booster. Declines flu shots Pap smear:  02/2018 with GYN Alcohol: 1-3 drink/week Diet: healthy, well-rounded. Limited fast food.  Gets dairy (cheese, yogurt). Dentist: twice yearly Ophtho: recent Exercise: Gym--strength and cardio (boxing 2x/week, HIIT workout once a week). Walks probably 5 miles/day (commuting, including a walk through the park), lots of stairs.  Review of chart-- Normal glu, CBC 12/2018 (normal CBC done yesterday through GYN, 06/29/20) Vitamin D-OH 30.5 in 12/2018 Lipid screen (nonfasting): Lab Results  Component Value Date   CHOL 158 03/01/2015   HDL 64 03/01/2015   LDLCALC 84 03/01/2015   TRIG 49 03/01/2015   CHOLHDL 2.5 03/01/2015  She had repeat lipids drawn by GYN yesterday, results not back yet.  PMH, PSH, SH and FH were reviewed and updated.  Outpatient Encounter Medications as of 06/30/2020  Medication Sig Note  . Multiple Vitamin (MULTIVITAMIN) tablet Take 1 tablet by mouth daily. 06/30/2020: Ritual brand w/mg  . NON FORMULARY as needed. Triamcinolone . 1% and Cetaphil lotion 1:1 ratio to use as needed. dispense 60 Gram 02/04/2018: Triamcinolone .1% and cetaphil lotion in 1:1 ratio    . NON FORMULARY Take 2 tablets by mouth daily. 06/30/2020: Ray Brand-Hormone Balance  . albuterol (PROVENTIL HFA;VENTOLIN HFA) 108 (90 BASE)  MCG/ACT inhaler Inhale 2 puffs into the lungs every 6 (six) hours as needed for wheezing or shortness of breath. (Patient not taking: Reported on 06/30/2020) 06/30/2020: Hasn't used in years (EIA, usually altitude-induced)   No  facility-administered encounter medications on file as of 06/30/2020.   The "hormone balance" supplement contains Vitamins A,C,E, superfood mushrooms, lavendar, dandelion, St. John's Wort.  She had many questions about these ingredients and was discussed in detail.  Allergies  Allergen Reactions  . Shellfish Allergy Other (See Comments)    Sharp abdominal pain    ROS: The patient denies anorexia, fever, weight changes, headaches, vision changes, decreased hearing, ear pain, sore throat, breast concerns, chest pain, palpitations, dizziness, syncope, dyspnea on exertion, cough, swelling, nausea, vomiting, diarrhea, constipation, abdominal pain, melena, hematochezia, indigestion/heartburn, hematuria, incontinence, dysuria, irregular menses, vaginal discharge, odor or itch, joint pains, numbness, tingling, weakness, tremor, suspicious skin lesions, depression, anxiety, abnormal bleeding/bruising, or enlarged lymph nodes. No issues with asthma or needing inhaler in many years. L sided intermittent pain since October per HPI.    PHYSICAL EXAM:  BP 104/68   Pulse 68   Ht '5\' 8"'  (1.727 m)   Wt 134 lb 9.6 oz (61.1 kg)   LMP 06/10/2020 (Exact Date)   BMI 20.47 kg/m   Wt Readings from Last 3 Encounters:  06/30/20 134 lb 9.6 oz (61.1 kg)  12/23/18 143 lb (64.9 kg)  03/05/17 138 lb (62.6 kg)   General Appearance:    Alert, cooperative, no distress, appears stated age  Head:    Normocephalic, without obvious abnormality, atraumatic  Eyes:    PERRL, conjunctiva/corneas clear, EOM's intact, fundi    benign  Ears:    Normal TM's and external ear canals  Nose:   Not examined (wearing mask due to COVID-19)  Throat:   Not examined (wearing mask due to COVID-19)l  Neck:   Supple, no lymphadenopathy;  thyroid:  no   enlargement/tenderness/nodules; no carotid bruit or JVD  Back:    Spine nontender, no curvature, ROM normal, no CVA     tenderness  Lungs:     Clear to auscultation bilaterally  without wheezes, rales or     ronchi; respirations unlabored  Chest Wall:    No tenderness or deformity   Heart:    Regular rate and rhythm, S1 and S2 normal, no murmur, rub   or gallop  Breast Exam:    Deferred to GYN  Abdomen:     Soft, non-tender, nondistended, normoactive bowel sounds,    no masses, no hepatosplenomegaly  Genitalia:    Deferred to GYN     Extremities:   No clubbing, cyanosis or edema  Pulses:   2+ and symmetric all extremities  Skin:   Skin color,, turgor normal.  Dry skin on arms near elbows, but not true rash/lesions  Lymph nodes:   Cervical, supraclavicular, and inguinal nodes normal  Neurologic:   Normal strength, sensation and gait; reflexes 2+ and symmetric throughout          Psych:   Normal mood, affect, hygiene and grooming.    Labs from GYN: Normal CBC A1c, lipid, c-met, testosterone, TSH are all still pending.   ASSESSMENT/PLAN:  Annual physical exam - Plan: POCT Urinalysis DIP (Proadvantage Device)  LLQ pain - intermittent.  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  Normal pelvic US without cysts, done earlier this week  General counseling and advice on contraceptive management - Discussed IUD in detail, indications, risks/SE. Not sure that she has true indication, or that  she will get benefit from hormones, but will leave up to GYN  Eczema, unspecified type - Currently skin is just dry, not quite full flare. Encouraged moisturizing TID, not to use TAC for current dryness, only if worse/flares   Encouraged flu shot--declined today. Encouraged to resconsider and return. IUD discussed at length.  Menses are regular, does NOT have PCS, not sexually active.  Not sure of reasons she needs IUD now.  No anemia, cycles aren't heavy or prolonged.  Discussed condom use if/when becomes sexually active. She isn't sure if the hormones might benefit her with respect to concerns she mentioned (testosterone level).  Current labs are pending.  Discussed in detail and she will revisit  this with her GYN before scheduling IUD placement.  She appreciated the discussion.  Discussed monthly self breast exams and yearly mammograms after the age of 104; at least 30 minutes of aerobic activity at least 5 days/week, weight-bearing exercise 2x/wk; proper sunscreen use reviewed; healthy diet, including goals of calcium and vitamin D intake and alcohol recommendations; regular seatbelt use; safe sex/abstinence/plan B. Immunization recommendations discussed.  Yearly flu shots are recommended, declines today.  F/u 1 year, sooner prn.

## 2020-06-29 NOTE — Patient Instructions (Addendum)
HEALTH MAINTENANCE RECOMMENDATIONS:  It is recommended that you get at least 30 minutes of aerobic exercise at least 5 days/week (for weight loss, you may need as much as 60-90 minutes). This can be any activity that gets your heart rate up. This can be divided in 10-15 minute intervals if needed, but try and build up your endurance at least once a week.  Weight bearing exercise is also recommended twice weekly.  Eat a healthy diet with lots of vegetables, fruits and fiber.  "Colorful" foods have a lot of vitamins (ie green vegetables, tomatoes, red peppers, etc).  Limit sweet tea, regular sodas and alcoholic beverages, all of which has a lot of calories and sugar.  Up to 1 alcoholic drink daily may be beneficial for women (unless trying to lose weight, watch sugars).  Drink a lot of water.  Calcium recommendations are 1200-1500 mg daily (1500 mg for postmenopausal women or women without ovaries), and vitamin D 1000 IU daily.  This should be obtained from diet and/or supplements (vitamins), and calcium should not be taken all at once, but in divided doses.  Monthly self breast exams and yearly mammograms for women over the age of 39 is recommended.  Sunscreen of at least SPF 30 should be used on all sun-exposed parts of the skin when outside between the hours of 10 am and 4 pm (not just when at beach or pool, but even with exercise, golf, tennis, and yard work!)  Use a sunscreen that says "broad spectrum" so it covers both UVA and UVB rays, and make sure to reapply every 1-2 hours.  Remember to change the batteries in your smoke detectors when changing your clock times in the spring and fall. Carbon monoxide detectors are recommended for your home.  Use your seat belt every time you are in a car, and please drive safely and not be distracted with cell phones and texting while driving.  Please consider getting a flu shot.  Sounds like your pain may be related to ovulation (Mittelschmerz). Use  ibuprofen as needed.   Mittelschmerz Mittelschmerz is a condition that affects women. It is pain in the abdomen that occurs between periods. The pain in this condition:  Affects one side of the abdomen. The side that it affects may change from month to month.  May be mild or severe.  May last from minutes to hours. It does not last longer than 1-2 days.  Can happen with nausea and light vaginal bleeding. Mittelschmerz is caused by the growth and release of an egg from an ovary, and it is a natural part of the ovulation cycle. It often happens about 2 weeks after a woman's period ends. Follow these instructions at home: Pay attention to any changes in your condition. Let your health care provider know about them. Take these actions to help with your pain:  Try soaking in a hot bath.  Try a heating pad to relax the muscles in the abdomen.  Take over-the-counter and prescription medicines only as told by your health care provider.  Keep all follow-up visits as told by your health care provider. This is important. Contact a health care provider if:  You have very bad pain most months.  You have abdominal pain that lasts longer than 24 hours.  Your pain medicine is not helping.  You have a fever.  You have nausea or vomiting that will not go away.  You miss your period.  You have vaginal bleeding between your periods that  is heavier than spotting. Summary  Mittelschmerz is a condition that affects women. It is pain in the abdomen that occurs between periods.  Mittelschmerz is caused by the growth and release of an egg from an ovary, and it is a natural part of the ovulation cycle.  The pain in this condition may affect one side of the abdomen, may be mild or severe, or may cause nausea.  To relieve your pain, try soaking in a hot bath, using a heating pad, or taking prescription or over-the-counter medicines.  Contact a health care provider if you have pain most months of  the year, your pain lasts more than 24 hours, your pain medicine is not helping, or you have vaginal bleeding that is heavier than spotting. This information is not intended to replace advice given to you by your health care provider. Make sure you discuss any questions you have with your health care provider. Document Revised: 01/08/2018 Document Reviewed: 01/08/2018 Elsevier Patient Education  2020 [employer].   Review amounts of vitamins from the hormone supplement and the multivitamin to make sure you aren't getting too much of vitamin A and E. You may also want to look up risks of St. John's Wort.  This is good for treating depression, which you don't have.

## 2020-06-30 ENCOUNTER — Encounter: Payer: Self-pay | Admitting: Family Medicine

## 2020-06-30 ENCOUNTER — Other Ambulatory Visit: Payer: Self-pay

## 2020-06-30 ENCOUNTER — Ambulatory Visit (INDEPENDENT_AMBULATORY_CARE_PROVIDER_SITE_OTHER): Payer: 59 | Admitting: Family Medicine

## 2020-06-30 VITALS — BP 104/68 | HR 68 | Ht 68.0 in | Wt 134.6 lb

## 2020-06-30 DIAGNOSIS — Z0000 Encounter for general adult medical examination without abnormal findings: Secondary | ICD-10-CM | POA: Diagnosis not present

## 2020-06-30 DIAGNOSIS — R1032 Left lower quadrant pain: Secondary | ICD-10-CM | POA: Diagnosis not present

## 2020-06-30 DIAGNOSIS — Z3009 Encounter for other general counseling and advice on contraception: Secondary | ICD-10-CM | POA: Diagnosis not present

## 2020-06-30 DIAGNOSIS — L309 Dermatitis, unspecified: Secondary | ICD-10-CM

## 2020-06-30 LAB — POCT URINALYSIS DIP (PROADVANTAGE DEVICE)
Bilirubin, UA: NEGATIVE
Glucose, UA: NEGATIVE mg/dL
Ketones, POC UA: NEGATIVE mg/dL
Leukocytes, UA: NEGATIVE
Nitrite, UA: NEGATIVE
Protein Ur, POC: NEGATIVE mg/dL
Specific Gravity, Urine: 1.025
Urobilinogen, Ur: NEGATIVE
pH, UA: 6 (ref 5.0–8.0)

## 2021-05-05 ENCOUNTER — Telehealth: Payer: Self-pay

## 2021-05-05 ENCOUNTER — Other Ambulatory Visit: Payer: Self-pay

## 2021-05-05 NOTE — Telephone Encounter (Signed)
Adam has faxed this fax form back with 0 refills

## 2021-05-05 NOTE — Telephone Encounter (Signed)
I bumped into her mother (and patient) over the weekend.  She moved to [place], but is in town now. Okay to refill this once.  I doubt she will be coming back here anymore, since she lives out of the country, but if she intends to come back, she will need an appointment for any further refills.

## 2021-05-05 NOTE — Telephone Encounter (Signed)
Received fax from [facility] [address] For a refill on the pts. Triamcinolone w/ cetaphil 60 gm as needed for eczema. Pt. Last apt was 06/30/20.

## 2021-05-10 ENCOUNTER — Telehealth: Payer: Self-pay | Admitting: Family Medicine

## 2021-05-10 NOTE — Telephone Encounter (Signed)
Father called and states he is dismissing his family from our practice and wants to bill [place] for his dtr sitting waiting on dr.  I asked which pt and he hung up.
